# Patient Record
Sex: Female | Born: 1975 | Race: White | Hispanic: No | Marital: Single | Smoking: Current every day smoker
Health system: Southern US, Community
[De-identification: ages and names within clinical notes are randomized; demographics above are authoritative.]

## PROBLEM LIST (undated history)

## (undated) HISTORY — PX: TUBAL LIGATION: SHX77

## (undated) HISTORY — PX: HIP SURGERY: SHX245

## (undated) HISTORY — PX: FOREARM SURGERY: SHX651

---

## 2005-03-20 ENCOUNTER — Emergency Department: Payer: Self-pay | Admitting: Emergency Medicine

## 2005-08-22 ENCOUNTER — Emergency Department: Payer: Self-pay | Admitting: Emergency Medicine

## 2006-01-29 ENCOUNTER — Emergency Department: Payer: Self-pay | Admitting: Emergency Medicine

## 2006-04-20 ENCOUNTER — Emergency Department: Payer: Self-pay | Admitting: Emergency Medicine

## 2006-11-09 ENCOUNTER — Emergency Department: Payer: Self-pay | Admitting: Internal Medicine

## 2007-02-16 ENCOUNTER — Emergency Department: Payer: Self-pay | Admitting: Emergency Medicine

## 2007-03-30 ENCOUNTER — Emergency Department: Payer: Self-pay

## 2007-05-09 ENCOUNTER — Emergency Department: Payer: Self-pay | Admitting: Emergency Medicine

## 2007-07-07 ENCOUNTER — Emergency Department: Payer: Self-pay | Admitting: Emergency Medicine

## 2007-11-23 ENCOUNTER — Emergency Department: Payer: Self-pay | Admitting: Emergency Medicine

## 2008-09-16 ENCOUNTER — Emergency Department: Payer: Self-pay | Admitting: Emergency Medicine

## 2012-04-20 ENCOUNTER — Emergency Department: Payer: Self-pay | Admitting: Emergency Medicine

## 2014-06-20 ENCOUNTER — Emergency Department: Payer: Self-pay | Admitting: Emergency Medicine

## 2015-06-02 ENCOUNTER — Emergency Department
Admission: EM | Admit: 2015-06-02 | Discharge: 2015-06-02 | Disposition: A | Discharge status: Home / Self Care | Payer: Medicaid Other | Attending: Emergency Medicine | Admitting: Emergency Medicine

## 2015-06-02 ENCOUNTER — Encounter: Payer: Self-pay | Admitting: Emergency Medicine

## 2015-06-02 DIAGNOSIS — Z79899 Other long term (current) drug therapy: Secondary | ICD-10-CM | POA: Insufficient documentation

## 2015-06-02 DIAGNOSIS — L501 Idiopathic urticaria: Secondary | ICD-10-CM | POA: Insufficient documentation

## 2015-06-02 DIAGNOSIS — L299 Pruritus, unspecified: Secondary | ICD-10-CM | POA: Diagnosis present

## 2015-06-02 DIAGNOSIS — Z720 Tobacco use: Secondary | ICD-10-CM | POA: Diagnosis not present

## 2015-06-02 MED ORDER — TRIAMCINOLONE ACETONIDE 0.1 % EX OINT: 1.0000 "application " | TOPICAL_OINTMENT | Freq: Two times a day (BID) | CUTANEOUS | Status: DC

## 2015-06-02 MED ORDER — FAMOTIDINE 20 MG PO TABS
40.0000 mg | ORAL_TABLET | Freq: Once | ORAL | Status: AC
  Administered 2015-06-02: 40 mg via ORAL
  Filled 2015-06-02: qty 2

## 2015-06-02 MED ORDER — CYPROHEPTADINE HCL 4 MG PO TABS
4.0000 mg | ORAL_TABLET | Freq: Once | ORAL | Status: AC
  Administered 2015-06-02: 4 mg via ORAL
  Filled 2015-06-02: qty 1

## 2015-06-02 MED ORDER — CYPROHEPTADINE HCL 4 MG PO TABS: 4.0000 mg | ORAL_TABLET | Freq: Three times a day (TID) | ORAL | Status: DC | PRN

## 2015-06-02 MED ORDER — DEXAMETHASONE SODIUM PHOSPHATE 10 MG/ML IJ SOLN
10.0000 mg | Freq: Once | INTRAMUSCULAR | Status: AC
  Administered 2015-06-02: 10 mg via INTRAMUSCULAR
  Filled 2015-06-02: qty 1

## 2015-06-02 MED ORDER — RANITIDINE HCL 150 MG PO TABS: 150.0000 mg | ORAL_TABLET | Freq: Two times a day (BID) | ORAL | Status: DC

## 2015-06-02 MED ORDER — METHYLPREDNISOLONE 4 MG PO TBPK: ORAL_TABLET | ORAL | Status: DC

## 2015-06-02 NOTE — ED Notes (Signed)
Developed itching to hands ,feet and in groin area a few days ago. Had some hives earlier

## 2015-06-02 NOTE — ED Notes (Signed)
C/o itching to hands, feet and in the crease of her knees, denies any rash, symptoms started yesterday

## 2015-06-02 NOTE — ED Provider Notes (Signed)
[facility] Emergency Department Provider Note ____________________________________________  Time seen: 1530  I have reviewed the triage vital signs and the nursing notes.  HISTORY  Chief Complaint  Pruritis  HPI Debra Chandler is a 39 y.o. female reports to the ED for evaluation and treatment of itching that she has noted to her hands, feet, torso and groin area over the last few days. She is now beginning to notice some development of hives and some the scratch areas. She denies any known contacts, exposure, or allergy. 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 He denies any pain in triage.  History reviewed. No pertinent past medical history.  There are no active problems to display for this patient.  Past Surgical History  Procedure Laterality Date  . Forearm surgery    . Hip surgery     Current Outpatient Rx  Name  Route  Sig  Dispense  Refill  . buprenorphine-naloxone (SUBOXONE) 8-2 MG SUBL SL tablet   Sublingual   Place 1 tablet under the tongue daily.         . cyproheptadine (PERIACTIN) 4 MG tablet   Oral   Take 1 tablet (4 mg total) by mouth 3 (three) times daily as needed for allergies.   30 tablet   0   . methylPREDNISolone (MEDROL DOSEPAK) 4 MG TBPK tablet      6 day taper pack as directed.   21 tablet   0   . ranitidine (ZANTAC) 150 MG tablet   Oral   Take 1 tablet (150 mg total) by mouth 2 (two) times daily.   20 tablet   0   . triamcinolone ointment (KENALOG) 0.1 %   Topical   Apply 1 application topically 2 (two) times daily.   30 g   1     Allergies Review of patient's allergies indicates no known allergies.  No family history on file.  Social History Social History  Substance Use Topics  . Smoking status: Current Every Day Smoker  . Smokeless tobacco: None  . Alcohol Use: No    Review of Systems  Constitutional: Negative for fever. Eyes: Negative for visual changes. ENT: Negative for sore throat. Cardiovascular: Negative for chest pain. Respiratory: Negative for shortness of breath. Gastrointestinal: Negative for abdominal pain, vomiting and diarrhea. Genitourinary: Negative for dysuria. Musculoskeletal: Negative for back pain. Skin: Negative for rash. Itching as above. Neurological: Negative for headaches, focal weakness or numbness. ____________________________________________  PHYSICAL EXAM:  VITAL SIGNS: ED Triage Vitals  Enc Vitals Group     BP 06/02/15 1511 112/74 mmHg     Pulse Rate 06/02/15 1511 71     Resp 06/02/15 1511 18     Temp 06/02/15 1511 98.1 F (36.7 C)     Temp Source 06/02/15 1511 Oral     SpO2 06/02/15 1511 98 %     Weight 06/02/15 1511 132 lb (59.875 kg)     Height 06/02/15 1511  (1.651 m)     Head Cir --      Peak Flow --      Pain Score --      Pain Loc --      Pain Edu? --      Excl. in GC? --    Constitutional: Alert and oriented. Well appearing and in no distress. Eyes: Conjunctivae are normal. PERRL. Normal extraocular  movements. ENT   Head: Normocephalic and atraumatic.   Nose: No congestion/rhinorrhea.   Mouth/Throat: Mucous membranes are moist.   Neck: Supple. No thyromegaly. Hematological/Lymphatic/Immunological: No cervical lymphadenopathy. Cardiovascular: Normal rate, regular rhythm.  Respiratory: Normal respiratory effort. No wheezes/rales/rhonchi. Gastrointestinal: Soft and nontender. No distention. Musculoskeletal: Nontender with normal range of motion in all extremities.  Neurologic:  Normal gait without ataxia. Normal speech and language. No gross focal neurologic deficits are appreciated. Skin:  Skin is warm, dry and intact. No rash, burrows, Xopenex is noted to the hands or wrists. Patient with large erythematous patches across the torso and back, with multiple small hives  throughout. Psychiatric: Mood and affect are normal. Patient exhibits appropriate insight and judgment. ____________________________________________  PROCEDURES  Periactin 4 mg PO Famotidine 40 mg PO Decadron 10 mg IM ____________________________________________  INITIAL IMPRESSION / ASSESSMENT AND PLAN / ED COURSE  Idiopathic hives on presentation. No indication of any cellulitis, or mite infestation. Patient will be discharged with prescription for ranitidine, Periactin, and Medrol Dosepak. She can apply topical steroid cream as needed. She will follow-up with primary care provider for ongoing symptoms. ____________________________________________  FINAL CLINICAL IMPRESSION(S) / ED DIAGNOSES  Final diagnoses:  Idiopathic urticaria      Lissa Hoard, PA-C 06/02/15 1556  Darien Ramus, MD 06/02/15 2329

## 2015-06-02 NOTE — Discharge Instructions (Signed)
Hives Hives are itchy, red, swollen areas of the skin. They can vary in size and location on your body. Hives can come and go for hours or several days (acute hives) or for several weeks (chronic hives). Hives do not spread from person to person (noncontagious). They may get worse with scratching, exercise, and emotional stress. CAUSES   Allergic reaction to food, additives, or drugs.  Infections, including the common cold.  Illness, such as vasculitis, lupus, or thyroid disease.  Exposure to sunlight, heat, or cold.  Exercise.  Stress.  Contact with chemicals. SYMPTOMS   Red or white swollen patches on the skin. The patches may change size, shape, and location quickly and repeatedly.  Itching.  Swelling of the hands, feet, and face. This may occur if hives develop deeper in the skin. DIAGNOSIS  Your caregiver can usually tell what is wrong by performing a physical exam. Skin or blood tests may also be done to determine the cause of your hives. In some cases, the cause cannot be determined. TREATMENT  Mild cases usually get better with medicines such as antihistamines. Severe cases may require an emergency epinephrine injection. If the cause of your hives is known, treatment includes avoiding that trigger.  HOME CARE INSTRUCTIONS   Avoid causes that trigger your hives.  Take antihistamines as directed by your caregiver to reduce the severity of your hives. Non-sedating or low-sedating antihistamines are usually recommended. Do not drive while taking an antihistamine.  Take any other medicines prescribed for itching as directed by your caregiver.  Wear loose-fitting clothing.  Keep all follow-up appointments as directed by your caregiver. SEEK MEDICAL CARE IF:   You have persistent or severe itching that is not relieved with medicine.  You have painful or swollen joints. SEEK IMMEDIATE MEDICAL CARE IF:   You have a fever.  Your tongue or lips are swollen.  You have  trouble breathing or swallowing.  You feel tightness in the throat or chest.  You have abdominal pain. These problems may be the first sign of a life-threatening allergic reaction. Call your local emergency services (911 in [place] MAKE SURE YOU:   Understand these instructions.  Will watch your condition.  Will get help right away if you are not doing well or get worse. Document Released: 08/15/2005 Document Revised: 08/20/2013 Document Reviewed: 11/08/2011 [facility] Patient Information 2015 [place], [place]. This information is not intended to replace advice given to you by your health care provider. Make sure you discuss any questions you have with your health care provider.  Dose the prescription steroid taper pack until completely gone. Take the antihistamines, as needed. Follow-up with your provider as needed.
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
## 2015-06-05 ENCOUNTER — Encounter: Payer: Self-pay | Admitting: *Deleted

## 2015-06-05 ENCOUNTER — Emergency Department
Admission: EM | Admit: 2015-06-05 | Discharge: 2015-06-05 | Disposition: A | Discharge status: Home / Self Care | Payer: Medicaid Other | Attending: Emergency Medicine | Admitting: Emergency Medicine

## 2015-06-05 DIAGNOSIS — Z79899 Other long term (current) drug therapy: Secondary | ICD-10-CM | POA: Diagnosis not present

## 2015-06-05 DIAGNOSIS — Z720 Tobacco use: Secondary | ICD-10-CM | POA: Diagnosis not present

## 2015-06-05 DIAGNOSIS — K047 Periapical abscess without sinus: Secondary | ICD-10-CM | POA: Diagnosis not present

## 2015-06-05 DIAGNOSIS — K0889 Other specified disorders of teeth and supporting structures: Secondary | ICD-10-CM | POA: Diagnosis present

## 2015-06-05 MED ORDER — AMOXICILLIN-POT CLAVULANATE 875-125 MG PO TABS: 1.0000 | ORAL_TABLET | Freq: Two times a day (BID) | ORAL | Status: DC

## 2015-06-05 MED ORDER — MAGIC MOUTHWASH W/LIDOCAINE: 5.0000 mL | Freq: Four times a day (QID) | ORAL | Status: DC

## 2015-06-05 NOTE — ED Notes (Signed)
Pt reports right sided dental pain starting yesterday.

## 2015-06-05 NOTE — Discharge Instructions (Signed)

## 2015-06-05 NOTE — ED Provider Notes (Signed)
[facility] Emergency Department Provider Note  ____________________________________________  Time seen: Approximately 9:19 PM  I have reviewed the triage vital signs and the nursing notes.   HISTORY  Chief Complaint Dental Pain    HPI Debra Chandler is a 39 y.o. female presents to the emergency department complaining of dental pain on the right lower jaw. She states that she has a history of poor dentition and has a cracked tooth on that side that she pleases abscess. She states the pain is been increasing over the last few days but became unbearable this night. She ascribes it has a sharp pressure sensation. She denies any fever or chills. She denies any difficulty swallowing. She has not taken anything over-the-counter prior to arrival.   No past medical history on file.  There are no active problems to display for this patient.   Past Surgical History  Procedure Laterality Date  . Forearm surgery    . Hip surgery      Current Outpatient Rx  Name  Route  Sig  Dispense  Refill  . amoxicillin-clavulanate (AUGMENTIN) 875-125 MG tablet   Oral   Take 1 tablet by mouth 2 (two) times daily.   14 tablet   0   . buprenorphine-naloxone (SUBOXONE) 8-2 MG SUBL SL tablet   Sublingual   Place 1 tablet under the tongue daily.         . cyproheptadine (PERIACTIN) 4 MG tablet   Oral   Take 1 tablet (4 mg total) by mouth 3 (three) times daily as needed for allergies.   30 tablet   0   . magic mouthwash w/lidocaine SOLN   Oral   Take 5 mLs by mouth 4 (four) times daily.   240 mL   0     Dispense in 1/1/1/1 ratio   . methylPREDNISolone (MEDROL DOSEPAK) 4 MG TBPK tablet      6 day taper pack as directed.   21 tablet   0   . ranitidine (ZANTAC) 150 MG tablet   Oral   Take 1 tablet (150 mg total) by mouth 2 (two) times daily.   20 tablet   0   . triamcinolone ointment (KENALOG) 0.1 %   Topical   Apply 1 application topically 2 (two) times  daily.   30 g   1     Allergies Review of patient's allergies indicates no known allergies.  No family history on file.  Social History Social History  Substance Use Topics  . Smoking status: Current Every Day Smoker  . Smokeless tobacco: None  . Alcohol Use: No    Review of Systems Constitutional: No fever/chills Eyes: No visual changes. ENT: No sore throat. Endorses right lower jaw dental pain. Cardiovascular: Denies chest pain. Respiratory: Denies shortness of breath. Gastrointestinal: No abdominal pain.  No nausea, no vomiting.  No diarrhea.  No constipation. Genitourinary: Negative for dysuria. Musculoskeletal: Negative for back pain. Skin: Negative for rash. Neurological: Negative for headaches, focal weakness or numbness.  10-point ROS otherwise negative.  ____________________________________________   PHYSICAL EXAM:  VITAL SIGNS: ED Triage Vitals  Enc Vitals Group     BP 06/05/15 2107 122/79 mmHg     Pulse Rate 06/05/15 2107 80     Resp 06/05/15 2107 20     Temp 06/05/15 2107 98.2 F (36.8 C)     Temp Source 06/05/15 2107 Oral     SpO2 06/05/15 2107 100 %     Weight 06/05/15 2107 136 lb (  61.689 kg)     Height 06/05/15 2107  (1.651 m)     Head Cir --      Peak Flow --      Pain Score 06/05/15 2107 10     Pain Loc --      Pain Edu? --      Excl. in GC? --     Constitutional: Alert and oriented. Well appearing and in no acute distress. Eyes: Conjunctivae are normal. PERRL. EOMI. Head: Atraumatic. Nose: No congestion/rhinnorhea. Mouth/Throat: Mucous membranes are moist.  Oropharynx non-erythematous. Edema and erythema surrounding the second molar right lower jaw. Molar is broken. No fluctuance noted when palpated with tongue pressure. No drainage around tooth noted. Neck: No stridor.   Hematological/Lymphatic/Immunilogical: No cervical lymphadenopathy. Cardiovascular: Normal rate, regular rhythm. Grossly normal heart sounds.  Good peripheral  circulation. Respiratory: Normal respiratory effort.  No retractions. Lungs CTAB. Gastrointestinal: Soft and nontender. No distention. No abdominal bruits. No CVA tenderness. Musculoskeletal: No lower extremity tenderness nor edema.  No joint effusions. Neurologic:  Normal speech and language. No gross focal neurologic deficits are appreciated. No gait instability. Skin:  Skin is warm, dry and intact. No rash noted. Psychiatric: Mood and affect are normal. Speech and behavior are normal.  ____________________________________________   LABS (all labs ordered are listed, but only abnormal results are displayed)  Labs Reviewed - No data to display ____________________________________________  EKG   ____________________________________________  RADIOLOGY   ____________________________________________   PROCEDURES  Procedure(s) performed: None  Critical Care performed: No  ____________________________________________   INITIAL IMPRESSION / ASSESSMENT AND PLAN / ED COURSE  Pertinent labs & imaging results that were available during my care of the patient were reviewed by me and considered in my medical decision making (see chart for details).  The patient's history, symptoms, and physical exam are consistent with a dental abscess right lower jaw. Advised patient of findings and diagnosis and she verbalizes understanding of same. Advised the patient I will put her on antibiotics. Patient states that she has a dentist appointment within the week and advised patient to follow-up with them. Patient verbalizes understanding and compliance with treatment plan. ____________________________________________   FINAL CLINICAL IMPRESSION(S) / ED DIAGNOSES  Final diagnoses:  Dental abscess      Racheal Patches, PA-C 06/05/15 2147  Emily Filbert, MD 06/05/15 2217

## 2015-06-05 NOTE — ED Notes (Signed)
AAOx3.  Skin warm and dry.  NAD 

## 2015-06-05 NOTE — ED Notes (Signed)
Pt has a right lower toothache for 2 days.   Pain worse today.   Taking otc meds without relief.

## 2019-04-23 ENCOUNTER — Other Ambulatory Visit: Payer: Self-pay

## 2019-04-23 ENCOUNTER — Ambulatory Visit
Admission: EM | Admit: 2019-04-23 | Discharge: 2019-04-23 | Disposition: A | Discharge status: Home / Self Care | Payer: BLUE CROSS/BLUE SHIELD | Attending: Family Medicine | Admitting: Family Medicine

## 2019-04-23 DIAGNOSIS — N760 Acute vaginitis: Secondary | ICD-10-CM | POA: Diagnosis not present

## 2019-04-23 DIAGNOSIS — B9689 Other specified bacterial agents as the cause of diseases classified elsewhere: Secondary | ICD-10-CM

## 2019-04-23 LAB — URINALYSIS, COMPLETE (UACMP) WITH MICROSCOPIC
Bilirubin Urine: NEGATIVE
Glucose, UA: NEGATIVE mg/dL
Ketones, ur: NEGATIVE mg/dL
Leukocytes,Ua: NEGATIVE
Nitrite: NEGATIVE
Protein, ur: NEGATIVE mg/dL
Specific Gravity, Urine: 1.01 (ref 1.005–1.030)
pH: 7 (ref 5.0–8.0)

## 2019-04-23 LAB — WET PREP, GENITAL
Sperm: NONE SEEN
Trich, Wet Prep: NONE SEEN
Yeast Wet Prep HPF POC: NONE SEEN

## 2019-04-23 MED ORDER — FLUCONAZOLE 150 MG PO TABS: ORAL_TABLET | ORAL | 0 refills | Status: DC

## 2019-04-23 MED ORDER — METRONIDAZOLE 0.75 % VA GEL: 1.0000 | Freq: Two times a day (BID) | VAGINAL | 0 refills | Status: DC

## 2019-04-23 MED ORDER — METRONIDAZOLE 500 MG PO TABS: 500.0000 mg | ORAL_TABLET | Freq: Two times a day (BID) | ORAL | 0 refills | Status: DC

## 2019-04-23 NOTE — Discharge Instructions (Signed)
It was very nice seeing you today in clinic. Thank you for entrusting me with your care.   Please utilize the medications that we discussed. Your prescriptions have been called in to your pharmacy. Increase fluid intake as much as possible. Water is always best.  Make arrangements to follow up with your regular doctor in 1 week for re-evaluation if not improving. I have provided you the name and office contact information for an excellent local provider (urologist) for you to see and discuss urinary concerns. If your symptoms/condition worsens, please seek follow up care either here or in the ER. Please remember, our [place] providers are "right here with you" when you need [place].   Again, it was my pleasure to take care of you today. Thank you for choosing our clinic. I hope that you start to feel better quickly.   Honor Loh, MSN, APRN, FNP-C, CEN Advanced Practice Provider [facility]

## 2019-04-23 NOTE — ED Triage Notes (Signed)
Patient states that she has been having urinary frequency and urgency. Patient states that urine has a smell and she is concerned about yeast and BV as well.

## 2019-04-23 NOTE — ED Provider Notes (Signed)
[place], [place]   Name: Debra Chandler DOB: 06/21/1976 MRN: [id number] CSN: [id number] [facility]CP: [facility] date and time:  04/23/19 1211  Chief Complaint:  Urinary Frequency   NOTE: Prior to seeing the patient today, I have reviewed the triage nursing documentation and vital signs. Clinical staff has updated patient's PMH/PSHx, current medication list, and drug allergies/intolerances to ensure comprehensive history available to assist in medical decision making.   History:   HPI: Debra Chandler is a 43 y.o. female who presents today with complaints of urinary symptoms that have been going on "for awhile". She complains of frequency and urgency, but denies dysuria. She has not appreciated any gross hematuria. She advises that her urine has been malodorous, with the odor being described as "burnt rubber". Patient denies any associated nausea, vomiting, fever, and chills. She has not experienced any pain in her lower back or flank area. Patient complains of significant gas pains today that caused her to be sent home from work. Patient reports that the pain radiated to her "lady parts" (vagina) and resolved after she was able to "boo boo" (defecate).   Patient denies that she engages in unprotected sexual activity; advising that she is currently not active at all. She denies any current vaginal pain or bleeding. She notes malodorous vaginal discharge that is described as "white and watery" for over a year. She states, "I have recurrent BV and they told me I needed to see a specialist, but I lost my insurance and it never happened. I have insurance now". She has been treating her BV with essential oils and diatomaceous earth. Patient just started a new job and reports that she cannot do her treatments as she has been doing, thus her symptoms have worsened.  Patient's last menstrual period was 04/08/2019. There are no concerns that she is currently pregnant.   History reviewed. No  pertinent past medical history.  Past Surgical History:  Procedure Laterality Date  . FOREARM SURGERY    . HIP SURGERY      Family History  Problem Relation Age of Onset  . Diabetes Father   . Cancer Father     Social History   Tobacco Use  . Smoking status: Current Every Day Smoker    Packs/day: 1.00    Types: Cigarettes  . Smokeless tobacco: Never Used  Substance Use Topics  . Alcohol use: No  . Drug use: Not Currently    There are no active problems to display for this patient.   Home Medications:    No outpatient medications have been marked as taking for the 04/23/19 encounter [facility] Encounter).    Allergies:   Patient has no known allergies.  Review of Systems (ROS): Review of Systems  Constitutional: Negative for chills and fever.  Respiratory: Negative for cough and shortness of breath.   Cardiovascular: Negative for chest pain and palpitations.  Gastrointestinal: Positive for abdominal pain (suprapubic). Negative for diarrhea, nausea and vomiting.  Genitourinary: Positive for frequency, urgency and vaginal discharge. Negative for decreased urine volume, dysuria, flank pain, genital sores, hematuria, pelvic pain, vaginal bleeding and vaginal pain.  Musculoskeletal: Negative for back pain.  Skin: Negative for color change, pallor and rash.  Neurological: Negative for dizziness, syncope, weakness and headaches.  All other systems reviewed and are negative.    Vital Signs: Today's Vitals   04/23/19 1230 04/23/19 1231 04/23/19 1312 04/23/19 1317  BP:  119/61    Pulse:  (!) 103    Resp:  17    Temp:  98.5 F (36.9 C)    TempSrc:  Oral    SpO2:  99%    Weight: 105 lb (47.6 kg)     Height: 5\' 5"  (1.651 m)     PainSc: 3   3  8      Physical Exam: Physical Exam  Constitutional: She is oriented to person, place, and time and well-developed, well-nourished, and in no distress.  HENT:  Head: Normocephalic and atraumatic.  Mouth/Throat: Mucous  membranes are normal.  Eyes: Pupils are equal, round, and reactive to light.  Neck: Normal range of motion. Neck supple. No tracheal deviation present.  Cardiovascular: Normal rate, regular rhythm, normal heart sounds and intact distal pulses. Exam reveals no gallop and no friction rub.  No murmur heard. Pulmonary/Chest: Effort normal and breath sounds normal. No respiratory distress. She has no wheezes. She has no rales.  Abdominal: Soft. Normal appearance and bowel sounds are normal. There is abdominal tenderness in the suprapubic area. There is no CVA tenderness.  Genitourinary:    Genitourinary Comments: Exam deferred. No vaginal/pelvic pain or bleeding. Patient is not currently pregnant. She has elected to self collect specimen swab for wet prep.    Lymphadenopathy:    She has no cervical adenopathy.  Neurological: She is alert and oriented to person, place, and time. Gait normal.  Skin: Skin is warm and dry. No rash noted.  Psychiatric: Mood, memory, affect and judgment normal.  Nursing note and vitals reviewed.   Urgent Care Treatments / Results:   LABS: PLEASE NOTE: all labs that were ordered this encounter are listed, however only abnormal results are displayed. Labs Reviewed  WET PREP, GENITAL - Abnormal; Notable for the following components:      Result Value   Clue Cells Wet Prep HPF POC PRESENT (*)    WBC, Wet Prep HPF POC MODERATE (*)    All other components within normal limits  URINALYSIS, COMPLETE (UACMP) WITH MICROSCOPIC - Abnormal; Notable for the following components:   Hgb urine dipstick TRACE (*)    Bacteria, UA FEW (*)    All other components within normal limits    EKG: -None  RADIOLOGY: No results found.  PROCEDURES: Procedures  MEDICATIONS RECEIVED THIS VISIT: Medications - No data to display  PERTINENT CLINICAL COURSE NOTES/UPDATES:   Initial Impression / Assessment and Plan / Urgent Care Course:  Pertinent labs & imaging results that were  available during my care of the patient were personally reviewed by me and considered in my medical decision making (see lab/imaging section of note for values and interpretations).  Debra Chandler is a 43 y.o. female who presents to [facility] today with complaints of Urinary Frequency   Patient is well appearing overall in clinic today. She does not appear to be in any acute distress. Presenting symptoms (see HPI) and exam as documented above. UA negative for infection. Wet prep (+) for clue cells. Will treat for BV using a 7 day course of metronidazole. Patient encouraged to avoid ETOH while using this medication to present dilsulfram like reaction that will result in nausea and vomiting. She was advised to increase her fluid intake in efforts to flush urinary tract. She notes that she always gets a resulting candidal infection when taking metronidazole. Will send in a prescription for fluconazole for PRN use. Patient requesting to see someone about her malodorous urine. Name and office contact information provided on today's AVS for Dr. Ilean Skill. Patient advised the  she will need to contact the office to schedule an appointment to be seen.   Discussed follow up with primary care physician in 1 week for re-evaluation. I have reviewed the follow up and strict return precautions for any new or worsening symptoms. Patient is aware of symptoms that would be deemed urgent/emergent, and would thus require further evaluation either here or in the emergency department. At the time of discharge, she verbalized understanding and consent with the discharge plan as it was reviewed with her. All questions were fielded by provider and/or clinic staff prior to patient discharge.    Final Clinical Impressions / Urgent Care Diagnoses:   Final diagnoses:  BV (bacterial vaginosis)    New Prescriptions:  [place] Controlled Substance Registry consulted? Not Applicable  Meds ordered this encounter   Medications  . metroNIDAZOLE (FLAGYL) 500 MG tablet    Sig: Take 1 tablet (500 mg total) by mouth 2 (two) times daily.    Dispense:  14 tablet    Refill:  0  . fluconazole (DIFLUCAN) 150 MG tablet    Sig: Take 1 tablet (150 mg) by mouth. May repeat dose in 3 days if still having symptoms.    Dispense:  2 tablet    Refill:  0    Recommended Follow up Care:  Patient encouraged to follow up with the following provider within the specified time frame, or sooner as dictated by the severity of her symptoms. As always, she was instructed that for any urgent/emergent care needs, she should seek care either here or in the emergency department for more immediate evaluation.  Follow-up Information    Call  Ilean Skillaniel, Michael, MD.   Specialty: Unknown Physician Specialty Contact information: [facility] [address] [place] [address] [phone]         NOTE: This note was prepared using Dragon dictation software along with smaller phrase technology. Despite my best ability to proofread, there is the potential that transcriptional errors may still occur from this process, and are completely unintentional.    Verlee MonteGray, Yul Diana E, NP 04/23/19 1333

## 2019-06-07 ENCOUNTER — Ambulatory Visit
Admission: EM | Admit: 2019-06-07 | Discharge: 2019-06-07 | Disposition: A | Discharge status: Home / Self Care | Payer: BLUE CROSS/BLUE SHIELD | Attending: Emergency Medicine | Admitting: Emergency Medicine

## 2019-06-07 DIAGNOSIS — B373 Candidiasis of vulva and vagina: Secondary | ICD-10-CM

## 2019-06-07 DIAGNOSIS — B3731 Acute candidiasis of vulva and vagina: Secondary | ICD-10-CM

## 2019-06-07 DIAGNOSIS — Z113 Encounter for screening for infections with a predominantly sexual mode of transmission: Secondary | ICD-10-CM

## 2019-06-07 DIAGNOSIS — Z3202 Encounter for pregnancy test, result negative: Secondary | ICD-10-CM

## 2019-06-07 DIAGNOSIS — Z711 Person with feared health complaint in whom no diagnosis is made: Secondary | ICD-10-CM

## 2019-06-07 LAB — URINALYSIS, COMPLETE (UACMP) WITH MICROSCOPIC
Bilirubin Urine: NEGATIVE
Glucose, UA: NEGATIVE mg/dL
Ketones, ur: NEGATIVE mg/dL
Leukocytes,Ua: NEGATIVE
Nitrite: NEGATIVE
Protein, ur: NEGATIVE mg/dL
Specific Gravity, Urine: 1.025 (ref 1.005–1.030)
pH: 6 (ref 5.0–8.0)

## 2019-06-07 LAB — WET PREP, GENITAL
Clue Cells Wet Prep HPF POC: NONE SEEN
Sperm: NONE SEEN
Trich, Wet Prep: NONE SEEN

## 2019-06-07 LAB — PREGNANCY, URINE: Preg Test, Ur: NEGATIVE

## 2019-06-07 MED ORDER — CEFTRIAXONE SODIUM 250 MG IJ SOLR
250.0000 mg | Freq: Once | INTRAMUSCULAR | Status: AC
  Administered 2019-06-07: 16:00:00 250 mg via INTRAMUSCULAR

## 2019-06-07 MED ORDER — AZITHROMYCIN 500 MG PO TABS
1000.0000 mg | ORAL_TABLET | Freq: Once | ORAL | Status: AC
  Administered 2019-06-07: 1000 mg via ORAL

## 2019-06-07 MED ORDER — FLUCONAZOLE 150 MG PO TABS: ORAL_TABLET | ORAL | 0 refills | Status: DC

## 2019-06-07 NOTE — ED Provider Notes (Signed)
[facility]    CSN: [id number] Arrival date & time: 06/07/19  1422      History   Chief Complaint Chief Complaint  Patient presents with  . Vaginal Discharge    HPI Debra Chandler is a 43 y.o. female.   Debra Chandler presents with complaints of vaginal discharge which now has become more heavy and "mucus" like. States she has had issues with recurrent/chronic BV for the past 5 years with multiple courses of antibiotics for this. Was seen here 8/26 with findings of BV, received treatment. States her symptoms briefly improved before returning. However, over the past two days this new mucus discharge started. She was recently sexually active with a partner, didn't use condoms. She is concerned about an STD, as states this feels similar to chlaymdia which she had many years ago. States her urine has an odor but otherwise no urinary symptoms. LMP was approximately a month ago, states she was "neutered."  No vaginal bleeding, pelvic or back pain. No fevers. Denies sores, lesions or open skin.     ROS per HPI, negative if not otherwise mentioned.      No past medical history on file.  There are no active problems to display for this patient.   Past Surgical History:  Procedure Laterality Date  . FOREARM SURGERY    . HIP SURGERY      OB History   No obstetric history on file.      Home Medications    Prior to Admission medications   Medication Sig Start Date End Date Taking? Authorizing Provider  fluconazole (DIFLUCAN) 150 MG tablet Take 1 tablet today. If still with symptoms may repeat in 3 days. 06/07/19   Zigmund Gottron, NP  metroNIDAZOLE (FLAGYL) 500 MG tablet Take 1 tablet (500 mg total) by mouth 2 (two) times daily. 04/23/19   Karen Kitchens, NP  cyproheptadine (PERIACTIN) 4 MG tablet Take 1 tablet (4 mg total) by mouth 3 (three) times daily as needed for allergies. 06/02/15 04/23/19  Menshew, Dannielle Karvonen, PA-C  ranitidine (ZANTAC) 150 MG tablet Take 1  tablet (150 mg total) by mouth 2 (two) times daily. 06/02/15 04/23/19  Menshew, Dannielle Karvonen, PA-C    Family History Family History  Problem Relation Age of Onset  . Diabetes Father   . Cancer Father     Social History Social History   Tobacco Use  . Smoking status: Current Every Day Smoker    Packs/day: 1.00    Types: Cigarettes  . Smokeless tobacco: Never Used  Substance Use Topics  . Alcohol use: No  . Drug use: Not Currently     Allergies   Patient has no known allergies.   Review of Systems Review of Systems   Physical Exam Triage Vital Signs ED Triage Vitals  Enc Vitals Group     BP 06/07/19 1443 111/65     Pulse Rate 06/07/19 1443 87     Resp 06/07/19 1443 16     Temp 06/07/19 1443 98.6 F (37 C)     Temp Source 06/07/19 1443 Oral     SpO2 06/07/19 1443 96 %     Weight 06/07/19 1440 110 lb (49.9 kg)     Height 06/07/19 1440 5\' 5"  (1.651 m)     Head Circumference --      Peak Flow --      Pain Score 06/07/19 1440 0     Pain Loc --  Pain Edu? --      Excl. in [place]? --    No data found.  Updated Vital Signs BP 111/65 (BP Location: Left Arm)   Pulse 87   Temp 98.6 F (37 C) (Oral)   Resp 16   Ht 5\' 5"  (1.651 m)   Wt 110 lb (49.9 kg)   SpO2 96%   BMI 18.30 kg/m    Physical Exam Constitutional:      General: She is not in acute distress.    Appearance: She is well-developed.  Cardiovascular:     Rate and Rhythm: Normal rate.  Pulmonary:     Effort: Pulmonary effort is normal.  Abdominal:     Palpations: Abdomen is soft. Abdomen is not rigid.     Tenderness: There is no abdominal tenderness. There is no guarding or rebound.  Genitourinary:    Comments: Denies sores, lesions, vaginal bleeding; no pelvic pain; gu exam deferred at this time, vaginal self swab collected.   Skin:    General: Skin is warm and dry.  Neurological:     Mental Status: She is alert and oriented to person, place, and time.      UC Treatments / Results   Labs (all labs ordered are listed, but only abnormal results are displayed) Labs Reviewed  WET PREP, GENITAL - Abnormal; Notable for the following components:      Result Value   Yeast Wet Prep HPF POC PRESENT (*)    WBC, Wet Prep HPF POC FEW (*)    All other components within normal limits  URINALYSIS, COMPLETE (UACMP) WITH MICROSCOPIC - Abnormal; Notable for the following components:   Hgb urine dipstick SMALL (*)    Bacteria, UA FEW (*)    All other components within normal limits  GC/CHLAMYDIA PROBE AMP  URINE CULTURE  PREGNANCY, URINE    EKG   Radiology No results found.  Procedures Procedures (including critical care time)  Medications Ordered in UC Medications  azithromycin (ZITHROMAX) tablet 1,000 mg (1,000 mg Oral Given 06/07/19 1602)  cefTRIAXone (ROCEPHIN) injection 250 mg (250 mg Intramuscular Given 06/07/19 1608)    Initial Impression / Assessment and Plan / UC Course  I have reviewed the triage vital signs and the nursing notes.  Pertinent labs & imaging results that were available during my care of the patient were reviewed by me and considered in my medical decision making (see chart for details).     Empiric treatment with azithromycin and rocephin provided here today pending sti testing. Will notify of any positive findings and if any changes to treatment are needed.  Yeast on wet prep treated with diflucan. Urine culture pending, few bacteria noted. Encouraged follow up with gyne for recurrent vaginal symptoms. Patient verbalized understanding and agreeable to plan.  Ambulatory out of clinic without difficulty.    Final Clinical Impressions(s) / UC Diagnoses   Final diagnoses:  Concern about STD in female without diagnosis  Yeast vaginitis     Discharge Instructions     We have treated you today for gonorrhea and chlamydia with results pending.  Will notify of any positive findings and if any changes to treatment are needed.  Your vaginal swab  does have yeast, but no indication of BV today. We will treat this as well.  Please follow up with gynecology due to your recurrent symptoms.       ED Prescriptions    Medication Sig Dispense Auth. Provider   fluconazole (DIFLUCAN) 150 MG tablet Take 1  tablet today. If still with symptoms may repeat in 3 days. 2 tablet Georgetta Haber, NP     PDMP not reviewed this encounter.   Georgetta Haber, NP 06/07/19 1623

## 2019-06-07 NOTE — ED Triage Notes (Addendum)
As per patient has foul urine smell and burning she was seen in past for UTI and finished Antibiotics IN 03/2019 but after finishing her antibiotics her Sxs came back she also has some vaginal discharge onset 2 days.

## 2019-06-07 NOTE — Discharge Instructions (Addendum)
We have treated you today for gonorrhea and chlamydia with results pending.  Will notify of any positive findings and if any changes to treatment are needed.  Your vaginal swab does have yeast, but no indication of BV today. We will treat this as well.  Please follow up with gynecology due to your recurrent symptoms.

## 2019-06-09 LAB — GC/CHLAMYDIA PROBE AMP
Chlamydia trachomatis, NAA: NEGATIVE
Neisseria Gonorrhoeae by PCR: NEGATIVE

## 2019-06-10 ENCOUNTER — Telehealth (HOSPITAL_COMMUNITY): Payer: Self-pay | Admitting: Emergency Medicine

## 2019-06-10 ENCOUNTER — Encounter (HOSPITAL_COMMUNITY): Payer: Self-pay

## 2019-06-10 LAB — URINE CULTURE: Culture: 80000 — AB

## 2019-06-10 MED ORDER — CEPHALEXIN 500 MG PO CAPS: 500.0000 mg | ORAL_CAPSULE | Freq: Two times a day (BID) | ORAL | 0 refills | Status: AC

## 2019-06-10 MED ORDER — FLUCONAZOLE 150 MG PO TABS: 150.0000 mg | ORAL_TABLET | Freq: Once | ORAL | 0 refills | Status: AC

## 2019-06-10 NOTE — Telephone Encounter (Signed)
Pt requesting treatment for yeast, took both pills already, okay to send another per natalie

## 2019-06-10 NOTE — Telephone Encounter (Signed)
Per natalie, send keflex 500 bid x7 days to pharmacy on record.

## 2019-06-10 NOTE — Telephone Encounter (Signed)
Not on antibiotics, Attempted to reach patient to see how she was feeling. No answer at this time. Voicemail left.

## 2020-01-13 ENCOUNTER — Other Ambulatory Visit: Payer: Self-pay

## 2020-01-13 ENCOUNTER — Ambulatory Visit
Admission: EM | Admit: 2020-01-13 | Discharge: 2020-01-13 | Disposition: A | Discharge status: Home / Self Care | Payer: BLUE CROSS/BLUE SHIELD | Attending: Family Medicine | Admitting: Family Medicine

## 2020-01-13 DIAGNOSIS — Z202 Contact with and (suspected) exposure to infections with a predominantly sexual mode of transmission: Secondary | ICD-10-CM | POA: Diagnosis present

## 2020-01-13 LAB — CHLAMYDIA/NGC RT PCR (ARMC ONLY)
Chlamydia Tr: NOT DETECTED
N gonorrhoeae: NOT DETECTED

## 2020-01-13 MED ORDER — AZITHROMYCIN 250 MG PO TABS: 1000.0000 mg | ORAL_TABLET | Freq: Once | ORAL | 0 refills | Status: AC

## 2020-01-13 MED ORDER — FLUCONAZOLE 150 MG PO TABS: 150.0000 mg | ORAL_TABLET | Freq: Once | ORAL | 0 refills | Status: AC

## 2020-01-13 NOTE — ED Provider Notes (Signed)
[facility]    CSN: [id number] Arrival date & time: 01/13/20  1510      History   Chief Complaint Chief Complaint  Patient presents with  . Exposure to STD   HPI  44 year old female presents with the above complaint.  Patient reports that she was notified by her sexual partner today that he tested positive for chlamydia.  She states that she is currently asymptomatic.  Patient is feeling well.  She desires testing today.  She would also like empiric treatment.  No other complaints or concerns at this time.  Past Surgical History:  Procedure Laterality Date  . FOREARM SURGERY    . HIP SURGERY     OB History   No obstetric history on file.    Home Medications    Prior to Admission medications   Medication Sig Start Date End Date Taking? Authorizing Provider  azithromycin (ZITHROMAX) 250 MG tablet Take 4 tablets (1,000 mg total) by mouth once for 1 dose. 01/13/20 01/13/20  Coral Spikes, DO  fluconazole (DIFLUCAN) 150 MG tablet Take 1 tablet (150 mg total) by mouth once for 1 dose. Repeat dose in 72 hours. 01/13/20 01/13/20  Coral Spikes, DO  cyproheptadine (PERIACTIN) 4 MG tablet Take 1 tablet (4 mg total) by mouth 3 (three) times daily as needed for allergies. 06/02/15 04/23/19  Menshew, Dannielle Karvonen, PA-C  ranitidine (ZANTAC) 150 MG tablet Take 1 tablet (150 mg total) by mouth 2 (two) times daily. 06/02/15 04/23/19  Menshew, Dannielle Karvonen, PA-C   Family History Family History  Problem Relation Age of Onset  . Diabetes Father   . Cancer Father    Social History Social History   Tobacco Use  . Smoking status: Current Every Day Smoker    Packs/day: 0.75    Types: Cigarettes  . Smokeless tobacco: Never Used  Substance Use Topics  . Alcohol use: No  . Drug use: Yes    Types: Marijuana    Comment: last use yesterday   Allergies   Patient has no known allergies.   Review of Systems Review of Systems  Constitutional: Negative.   Genitourinary:  Negative.    Physical Exam Triage Vital Signs ED Triage Vitals  Enc Vitals Group     BP --      Pulse Rate 01/13/20 1626 69     Resp 01/13/20 1626 16     Temp 01/13/20 1626 98.1 F (36.7 C)     Temp Source 01/13/20 1626 Oral     SpO2 01/13/20 1626 100 %     Weight 01/13/20 1624 110 lb (49.9 kg)     Height 01/13/20 1624 5\' 5"  (1.651 m)     Head Circumference --      Peak Flow --      Pain Score 01/13/20 1624 0     Pain Loc --      Pain Edu? --      Excl. in [place]? --    Updated Vital Signs Pulse 69   Temp 98.1 F (36.7 C) (Oral)   Resp 16   Ht 5\' 5"  (1.651 m)   Wt 49.9 kg   LMP 12/24/2019   SpO2 100%   BMI 18.30 kg/m   Visual Acuity Right Eye Distance:   Left Eye Distance:   Bilateral Distance:    Right Eye Near:   Left Eye Near:    Bilateral Near:     Physical Exam Vitals and nursing note reviewed.  Constitutional:      General: She is not in acute distress.    Appearance: Normal appearance. She is not ill-appearing.  HENT:     Head: Normocephalic and atraumatic.  Eyes:     General:        Right eye: No discharge.        Left eye: No discharge.     Conjunctiva/sclera: Conjunctivae normal.  Cardiovascular:     Rate and Rhythm: Normal rate and regular rhythm.     Heart sounds: No murmur.  Pulmonary:     Effort: Pulmonary effort is normal. No respiratory distress.     Breath sounds: Normal breath sounds. No wheezing, rhonchi or rales.  Neurological:     Mental Status: She is alert.  Psychiatric:        Mood and Affect: Mood normal.        Behavior: Behavior normal.    UC Treatments / Results  Labs (all labs ordered are listed, but only abnormal results are displayed) Labs Reviewed  CHLAMYDIA/NGC RT PCR [facility] ONLY)    EKG   Radiology No results found.  Procedures Procedures (including critical care time)  Medications Ordered in UC Medications - No data to display  Initial Impression / Assessment and Plan / UC Course  I have  reviewed the triage vital signs and the nursing notes.  Pertinent labs & imaging results that were available during my care of the patient were reviewed by me and considered in my medical decision making (see chart for details).    44 year old female presents with STD exposure.  Patient desired empiric treatment today.  Azithromycin sent to the pharmacy.  We did not have a total of 1000 mg of azithromycin here today.  Diflucan if needed for yeast vaginitis.  Supportive care.  Final Clinical Impressions(s) / UC Diagnoses   Final diagnoses:  Exposure to STD     Discharge Instructions     Test results should return in 24 hours.  Antibiotic as prescribed.  No intercourse for the next 7 days.   Take care  Dr. Adriana Simas    ED Prescriptions    Medication Sig Dispense Auth. Provider   azithromycin (ZITHROMAX) 250 MG tablet Take 4 tablets (1,000 mg total) by mouth once for 1 dose. 4 tablet Robi Dewolfe G, DO   fluconazole (DIFLUCAN) 150 MG tablet Take 1 tablet (150 mg total) by mouth once for 1 dose. Repeat dose in 72 hours. 2 tablet Tommie Sams, DO     PDMP not reviewed this encounter.   Tommie Sams, [place] 01/13/20 2021

## 2020-01-13 NOTE — ED Triage Notes (Signed)
Pt reports her sexual partner called her today and stated he was positive for chlamydia. Pt denies sx currently. Last exposure yesterday.

## 2020-01-13 NOTE — Discharge Instructions (Signed)
Test results should return in 24 hours.  Antibiotic as prescribed.  No intercourse for the next 7 days.   Take care  Dr. Adriana Simas

## 2020-06-17 ENCOUNTER — Encounter: Payer: Self-pay | Admitting: Emergency Medicine

## 2020-06-17 ENCOUNTER — Other Ambulatory Visit: Payer: Self-pay

## 2020-06-17 DIAGNOSIS — S51852A Open bite of left forearm, initial encounter: Secondary | ICD-10-CM | POA: Diagnosis present

## 2020-06-17 DIAGNOSIS — F17210 Nicotine dependence, cigarettes, uncomplicated: Secondary | ICD-10-CM | POA: Diagnosis not present

## 2020-06-17 DIAGNOSIS — F1590 Other stimulant use, unspecified, uncomplicated: Secondary | ICD-10-CM | POA: Diagnosis not present

## 2020-06-17 DIAGNOSIS — Y9301 Activity, walking, marching and hiking: Secondary | ICD-10-CM | POA: Insufficient documentation

## 2020-06-17 DIAGNOSIS — W540XXA Bitten by dog, initial encounter: Secondary | ICD-10-CM | POA: Diagnosis not present

## 2020-06-17 DIAGNOSIS — Z23 Encounter for immunization: Secondary | ICD-10-CM | POA: Diagnosis not present

## 2020-06-17 NOTE — ED Triage Notes (Signed)
Pt to ED from home c/o left arm injury tonight.  States turned out lights and walking down hall and accidentally stepped on her dog and fell onto dog that bit her left forearm.  Pt with 3x3cm injury, bleeding controlled.

## 2020-06-17 NOTE — ED Notes (Signed)
Contacted [employer] and reported dog bite incident; operator st to have pt call back when d/c home to complete report

## 2020-06-18 ENCOUNTER — Emergency Department
Admission: EM | Admit: 2020-06-18 | Discharge: 2020-06-18 | Disposition: A | Discharge status: Home / Self Care | Payer: BLUE CROSS/BLUE SHIELD | Attending: Emergency Medicine | Admitting: Emergency Medicine

## 2020-06-18 DIAGNOSIS — W540XXA Bitten by dog, initial encounter: Secondary | ICD-10-CM

## 2020-06-18 MED ORDER — AMOXICILLIN-POT CLAVULANATE 875-125 MG PO TABS
1.0000 | ORAL_TABLET | Freq: Once | ORAL | Status: AC
  Administered 2020-06-18: 1 via ORAL
  Filled 2020-06-18: qty 1

## 2020-06-18 MED ORDER — AMOXICILLIN-POT CLAVULANATE 875-125 MG PO TABS: 1.0000 | ORAL_TABLET | Freq: Two times a day (BID) | ORAL | 0 refills | Status: AC

## 2020-06-18 MED ORDER — TETANUS-DIPHTH-ACELL PERTUSSIS 5-2.5-18.5 LF-MCG/0.5 IM SUSP
0.5000 mL | Freq: Once | INTRAMUSCULAR | Status: AC
  Administered 2020-06-18: 0.5 mL via INTRAMUSCULAR
  Filled 2020-06-18: qty 0.5

## 2020-06-18 MED ORDER — FLUCONAZOLE 150 MG PO TABS: 150.0000 mg | ORAL_TABLET | Freq: Every day | ORAL | 0 refills | Status: DC

## 2020-06-18 MED ORDER — LIDOCAINE HCL (PF) 1 % IJ SOLN
5.0000 mL | Freq: Once | INTRAMUSCULAR | Status: AC
  Administered 2020-06-18: 5 mL
  Filled 2020-06-18: qty 5

## 2020-06-18 NOTE — ED Notes (Signed)
Pt states dog bite to left arm (AC area), pts own dog, dog is up to date on shots. Bleeding controlled

## 2020-06-18 NOTE — Discharge Instructions (Addendum)
Please follow-up with your doctor, urgent care, or return to the emergency department for suture removal in 7 days.  Return to the emergency department for any signs of infection such as increased redness pus or fever.  Please take antibiotic for its entire course.

## 2020-06-18 NOTE — ED Provider Notes (Signed)
[facility] Emergency Department Provider Note  Time seen: 3:49 AM  I have reviewed the triage vital signs and the nursing notes.   HISTORY  Chief Complaint Animal Bite   HPI Debra Chandler is a 44 y.o. female with no significant past medical history presents to the emergency department for dog bite to her left arm.  According to the patient she was walking down her hallway it was dark and she accidentally stepped on her dog who in turn bit her arm.  Patient states the dog's vaccines are up-to-date.  Bleeding is controlled at this time.  No other injuries.   History reviewed. No pertinent past medical history.  There are no problems to display for this patient.   Past Surgical History:  Procedure Laterality Date  . FOREARM SURGERY    . HIP SURGERY      Prior to Admission medications   Medication Sig Start Date End Date Taking? Authorizing Provider  cyproheptadine (PERIACTIN) 4 MG tablet Take 1 tablet (4 mg total) by mouth 3 (three) times daily as needed for allergies. 06/02/15 04/23/19  Menshew, Charlesetta Ivory, PA-C  ranitidine (ZANTAC) 150 MG tablet Take 1 tablet (150 mg total) by mouth 2 (two) times daily. 06/02/15 04/23/19  Menshew, Charlesetta Ivory, PA-C    No Known Allergies  Family History  Problem Relation Age of Onset  . Diabetes Father   . Cancer Father     Social History Social History   Tobacco Use  . Smoking status: Current Every Day Smoker    Packs/day: 0.75    Types: Cigarettes  . Smokeless tobacco: Never Used  Vaping Use  . Vaping Use: Never used  Substance Use Topics  . Alcohol use: No  . Drug use: Yes    Types: Marijuana    Comment: last use yesterday    Review of Systems Constitutional: Negative for fever. Cardiovascular: Negative for chest pain. Respiratory: Negative for shortness of breath. Gastrointestinal: Negative for abdominal pain Musculoskeletal: Left arm pain Skin: Laceration to left forearm Neurological:  Negative for headache All other ROS negative  ____________________________________________   PHYSICAL EXAM:  VITAL SIGNS: ED Triage Vitals  Enc Vitals Group     BP 06/17/20 2108 124/67     Pulse Rate 06/17/20 2108 (!) 116     Resp 06/17/20 2108 16     Temp 06/17/20 2108 98.4 F (36.9 C)     Temp Source 06/17/20 2108 Oral     SpO2 06/17/20 2108 98 %     Weight 06/17/20 2110 110 lb (49.9 kg)     Height 06/17/20 2110 5\' 5"  (1.651 m)     Head Circumference --      Peak Flow --      Pain Score 06/17/20 2109 4     Pain Loc --      Pain Edu? --      Excl. in GC? --    Constitutional: Alert and oriented. Well appearing and in no distress. Eyes: Normal exam ENT      Head: Normocephalic and atraumatic.      Mouth/Throat: Mucous membranes are moist. Cardiovascular: Normal rate, regular rhythm. Respiratory: Normal respiratory effort without tachypnea nor retractions. Breath sounds are clear Gastrointestinal: Soft and nontender. No distention.   Musculoskeletal: Patient has an approximate 6 cm laceration to her left forearm with subcutaneous fat showing.  Hemostatic.  Neuro vastly intact distally. Neurologic:  Normal speech and language. No gross focal neurologic deficits  Skin: 6  cm laceration to the left forearm as described above. Psychiatric: Mood and affect are normal.   ____________________________________________   INITIAL IMPRESSION / ASSESSMENT AND PLAN / ED COURSE  Pertinent labs & imaging results that were available during my care of the patient were reviewed by me and considered in my medical decision making (see chart for details).   Patient presents to the emergency department after dog bite to her left forearm.  It is the patient's dog, vaccines up-to-date per patient.  She does not know when her last tetanus shot was.  We will update her tetanus shot.  As she does have a 6 cm gaping wound to her left forearm we will suture with 2-3 stitches for loose approximation,  cover with Augmentin.  Patient agreeable to plan of care.  Laceration repaired with 3 sutures.  We will discharge home with Augmentin.  LACERATION REPAIR Performed by: Minna Antis Authorized by: Minna Antis Consent: Verbal consent obtained. Risks and benefits: risks, benefits and alternatives were discussed Consent given by: patient Patient identity confirmed: provided demographic data Prepped and Draped in normal sterile fashion Wound explored  Laceration Location: Left forearm  Laceration Length: 6 cm  No Foreign Bodies seen or palpated  Anesthesia: local infiltration  Local anesthetic: lidocaine 1% without epinephrine  Anesthetic total: 4 ml  Irrigation method: syringe Amount of cleaning: standard  Skin closure: 4-0 Prolene  Number of sutures: 3  Technique: Simple interrupted  Patient tolerance: Patient tolerated the procedure well with no immediate complications.   [name] was evaluated in Emergency Department on 06/18/2020 for the symptoms described in the history of present illness. She was evaluated in the context of the global COVID-19 pandemic, which necessitated consideration that the patient might be at risk for infection with the SARS-CoV-2 virus that causes COVID-19. Institutional protocols and algorithms that pertain to the evaluation of patients at risk for COVID-19 are in a state of rapid change based on information released by regulatory bodies including the CDC and federal and state organizations. These policies and algorithms were followed during the patient's care in the ED.  ____________________________________________   FINAL CLINICAL IMPRESSION(S) / ED DIAGNOSES  Dog bite Laceration   Minna Antis, MD 06/18/20 [phone]

## 2021-01-25 ENCOUNTER — Encounter: Payer: Self-pay | Admitting: Emergency Medicine

## 2021-01-25 ENCOUNTER — Ambulatory Visit
Admission: EM | Admit: 2021-01-25 | Discharge: 2021-01-25 | Disposition: A | Discharge status: Home / Self Care | Payer: BLUE CROSS/BLUE SHIELD | Attending: Emergency Medicine | Admitting: Emergency Medicine

## 2021-01-25 DIAGNOSIS — A64 Unspecified sexually transmitted disease: Secondary | ICD-10-CM

## 2021-01-25 LAB — WET PREP, GENITAL
Clue Cells Wet Prep HPF POC: NONE SEEN
Sperm: NONE SEEN
Trich, Wet Prep: NONE SEEN
Yeast Wet Prep HPF POC: NONE SEEN

## 2021-01-25 LAB — CHLAMYDIA/NGC RT PCR (ARMC ONLY)
Chlamydia Tr: NOT DETECTED
N gonorrhoeae: NOT DETECTED

## 2021-01-25 MED ORDER — CEFTRIAXONE SODIUM 500 MG IJ SOLR
500.0000 mg | Freq: Once | INTRAMUSCULAR | Status: AC
  Administered 2021-01-25: 500 mg via INTRAMUSCULAR

## 2021-01-25 MED ORDER — DOXYCYCLINE HYCLATE 100 MG PO CAPS: 100.0000 mg | ORAL_CAPSULE | Freq: Two times a day (BID) | ORAL | 0 refills | Status: AC

## 2021-01-25 NOTE — ED Triage Notes (Signed)
Patient c/o sore throat that started yesterday. She states there are blisters in the back of her throat. She also reports vaginal odor and white/yellow vaginal discharge that started yesterday.

## 2021-01-25 NOTE — ED Provider Notes (Signed)
[facility]    CSN: [id number] Arrival date & time: 01/25/21  1509      History   Chief Complaint Chief Complaint  Patient presents with  . Sore Throat  . Vaginal Discharge    HPI Neosha Switalski is a 45 y.o. female.   HPI   45 year old female here for evaluation of sore throat and vaginal discharge.  Patient reports that she had unprotected oral and vaginal sex with her significant other 3 days ago and then yesterday she developed a sore throat and vaginal discharge with an odor.  She states that she is prone to BV so she used boric acid suppository thinking that is what it was.  Patient denies fever.  Patient has a history of genital warts and genital herpes.  History reviewed. No pertinent past medical history.  There are no problems to display for this patient.   Past Surgical History:  Procedure Laterality Date  . FOREARM SURGERY    . HIP SURGERY    . TUBAL LIGATION      OB History   No obstetric history on file.      Home Medications    Prior to Admission medications   Medication Sig Start Date End Date Taking? Authorizing Provider  doxycycline (VIBRAMYCIN) 100 MG capsule Take 1 capsule (100 mg total) by mouth 2 (two) times daily for 7 days. 01/25/21 02/01/21 Yes Becky Augusta, NP  cyproheptadine (PERIACTIN) 4 MG tablet Take 1 tablet (4 mg total) by mouth 3 (three) times daily as needed for allergies. 06/02/15 04/23/19  Menshew, Charlesetta Ivory, PA-C  ranitidine (ZANTAC) 150 MG tablet Take 1 tablet (150 mg total) by mouth 2 (two) times daily. 06/02/15 04/23/19  Menshew, Charlesetta Ivory, PA-C    Family History Family History  Problem Relation Age of Onset  . Diabetes Father   . Cancer Father     Social History Social History   Tobacco Use  . Smoking status: Current Every Day Smoker    Packs/day: 0.75    Types: Cigarettes  . Smokeless tobacco: Never Used  Vaping Use  . Vaping Use: Never used  Substance Use Topics  . Alcohol use: No  . Drug  use: Yes    Types: Marijuana    Comment: last use yesterday     Allergies   Patient has no known allergies.   Review of Systems Review of Systems  Constitutional: Negative for activity change, appetite change and fever.  HENT: Positive for sore throat.   Genitourinary: Positive for vaginal discharge. Negative for dysuria, frequency, urgency, vaginal bleeding and vaginal pain.  Skin: Negative for rash.  Hematological: Negative.   Psychiatric/Behavioral: Negative.      Physical Exam Triage Vital Signs ED Triage Vitals  Enc Vitals Group     BP 01/25/21 1553 112/64     Pulse Rate 01/25/21 1553 88     Resp 01/25/21 1553 18     Temp 01/25/21 1553 98 F (36.7 C)     Temp Source 01/25/21 1553 Oral     SpO2 01/25/21 1553 100 %     Weight 01/25/21 1551 110 lb 0.2 oz (49.9 kg)     Height 01/25/21 1551 5\' 5"  (1.651 m)     Head Circumference --      Peak Flow --      Pain Score 01/25/21 1551 0     Pain Loc --      Pain Edu? --      Excl. in  GC? --    No data found.  Updated Vital Signs BP 112/64 (BP Location: Left Arm)   Pulse 88   Temp 98 F (36.7 C) (Oral)   Resp 18   Ht 5\' 5"  (1.651 m)   Wt 110 lb 0.2 oz (49.9 kg)   LMP 12/21/2020   SpO2 100%   BMI 18.31 kg/m   Visual Acuity Right Eye Distance:   Left Eye Distance:   Bilateral Distance:    Right Eye Near:   Left Eye Near:    Bilateral Near:     Physical Exam Vitals and nursing note reviewed.  Constitutional:      Appearance: Normal appearance. She is normal weight.  HENT:     Mouth/Throat:     Mouth: Mucous membranes are moist.     Pharynx: Oropharyngeal exudate and posterior oropharyngeal erythema present.  Skin:    General: Skin is warm and dry.     Capillary Refill: Capillary refill takes less than 2 seconds.     Findings: No erythema or rash.  Neurological:     General: No focal deficit present.     Mental Status: She is alert and oriented to person, place, and time.  Psychiatric:        Mood  and Affect: Mood normal.        Behavior: Behavior normal.        Thought Content: Thought content normal.        Judgment: Judgment normal.      UC Treatments / Results  Labs (all labs ordered are listed, but only abnormal results are displayed) Labs Reviewed  WET PREP, GENITAL - Abnormal; Notable for the following components:      Result Value   WBC, Wet Prep HPF POC FEW (*)    All other components within normal limits  CHLAMYDIA/NGC RT PCR [facility] ONLY)    EKG   Radiology No results found.  Procedures Procedures (including critical care time)  Medications Ordered in UC Medications  cefTRIAXone (ROCEPHIN) injection 500 mg (has no administration in time range)    Initial Impression / Assessment and Plan / UC Course  I have reviewed the triage vital signs and the nursing notes.  Pertinent labs & imaging results that were available during my care of the patient were reviewed by me and considered in my medical decision making (see chart for details).   Patient is a pleasant, nontoxic-appearing 45 year old female here for evaluation of sore throat and vaginal discharge with odor.  She states that she had unprotected oral and vaginal sex with her boyfriend 3 days ago and then yesterday developed her symptoms.  She reports that her symptoms started with a white-yellow thick vaginal discharge that had an odor.  She initially thought it was BV and treated herself with a boric acid suppository.  She reports that later on while in church she started to develop a sore throat and when she got home she looked in the back of her throat and saw what she called white blisters.  She reports that today the white blisters appear to be further down in her throat as well.  Patient's physical exam reveals 2 pustules in the recesses of the soft palate superiorly on either side that are approximately the size of a pencil eraser and then a much larger white pus pocket on the lower left.   Patient has no cervical lymphadenopathy on exam.  Patient had wet prep and GC chlamydia swabs collected at triage.  Is negative for yeast, trichomonas, or clue cells.  There are few WBCs present.  Gonorrhea and Chlamydia swab are pending.  Will treat patient empirically for gonorrhea and chlamydia with 500 Rocephin IM in clinic and then doxycycline twice daily for 7 days at home.   Final Clinical Impressions(s) / UC Diagnoses   Final diagnoses:  STI (sexually transmitted infection)     Discharge Instructions     Take the doxycycline twice daily for 7 days for treatment of your presumptive GYN and throat infection.  Abstain from sexual intercourse until the results of your STI panel are back, and/or until you finish your doxycycline.  I would recommend that you talk with your partner about whether or not they are having any symptoms and encouraged him to be tested as well.    ED Prescriptions    Medication Sig Dispense Auth. Provider   doxycycline (VIBRAMYCIN) 100 MG capsule Take 1 capsule (100 mg total) by mouth 2 (two) times daily for 7 days. 14 capsule Becky Augusta, NP     PDMP not reviewed this encounter.   Becky Augusta, NP 01/25/21 1642

## 2021-01-25 NOTE — Discharge Instructions (Addendum)
Take the doxycycline twice daily for 7 days for treatment of your presumptive GYN and throat infection.  Abstain from sexual intercourse until the results of your STI panel are back, and/or until you finish your doxycycline.  I would recommend that you talk with your partner about whether or not they are having any symptoms and encouraged him to be tested as well.

## 2021-03-14 ENCOUNTER — Ambulatory Visit
Admission: EM | Admit: 2021-03-14 | Discharge: 2021-03-14 | Disposition: A | Discharge status: Home / Self Care | Payer: BLUE CROSS/BLUE SHIELD | Attending: Emergency Medicine | Admitting: Emergency Medicine

## 2021-03-14 ENCOUNTER — Other Ambulatory Visit: Payer: Self-pay

## 2021-03-14 DIAGNOSIS — S39012A Strain of muscle, fascia and tendon of lower back, initial encounter: Secondary | ICD-10-CM

## 2021-03-14 DIAGNOSIS — S161XXA Strain of muscle, fascia and tendon at neck level, initial encounter: Secondary | ICD-10-CM

## 2021-03-14 MED ORDER — BACLOFEN 10 MG PO TABS: 10.0000 mg | ORAL_TABLET | Freq: Three times a day (TID) | ORAL | 0 refills | Status: DC

## 2021-03-14 MED ORDER — IBUPROFEN 600 MG PO TABS: 600.0000 mg | ORAL_TABLET | Freq: Four times a day (QID) | ORAL | 0 refills | Status: AC | PRN

## 2021-03-14 NOTE — Discharge Instructions (Addendum)
Take the ibuprofen, 600 mg every 6 hours with food, on a schedule for the next 48 hours and then as needed.  Take the Baclofen, 10 mg every 8 hours, on a schedule for the next 48 hours and then as needed.  Apply moist heat to your neck/back for 30 minutes at a time 2-3 times a day to improve blood flow to the area and help remove the lactic acid causing the spasm.  Increase your oral fluid intake to a goal of your body weight in ounces to help flush the lactic acid from your system.  Follow the neck/back exercises given at discharge.  Return for reevaluation for any new or worsening symptoms.
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
## 2021-04-09 ENCOUNTER — Other Ambulatory Visit: Payer: Self-pay

## 2021-04-09 ENCOUNTER — Ambulatory Visit
Admission: EM | Admit: 2021-04-09 | Discharge: 2021-04-09 | Disposition: A | Discharge status: Home / Self Care | Payer: BLUE CROSS/BLUE SHIELD | Attending: Emergency Medicine | Admitting: Emergency Medicine

## 2021-04-09 ENCOUNTER — Encounter: Payer: Self-pay | Admitting: Emergency Medicine

## 2021-04-09 DIAGNOSIS — N898 Other specified noninflammatory disorders of vagina: Secondary | ICD-10-CM | POA: Insufficient documentation

## 2021-04-09 LAB — URINALYSIS, COMPLETE (UACMP) WITH MICROSCOPIC
Bilirubin Urine: NEGATIVE
Glucose, UA: NEGATIVE mg/dL
Ketones, ur: NEGATIVE mg/dL
Leukocytes,Ua: NEGATIVE
Nitrite: NEGATIVE
Protein, ur: NEGATIVE mg/dL
Specific Gravity, Urine: 1.025 (ref 1.005–1.030)
pH: 6.5 (ref 5.0–8.0)

## 2021-04-09 LAB — CHLAMYDIA/NGC RT PCR (ARMC ONLY)
Chlamydia Tr: NOT DETECTED
N gonorrhoeae: NOT DETECTED

## 2021-04-09 LAB — WET PREP, GENITAL
Clue Cells Wet Prep HPF POC: NONE SEEN
Sperm: NONE SEEN
Trich, Wet Prep: NONE SEEN
Yeast Wet Prep HPF POC: NONE SEEN

## 2021-04-09 NOTE — ED Triage Notes (Signed)
Pt is present today to be retested for STD. Pt states that vaginal discharge, dark urine, and slight odor.

## 2021-04-09 NOTE — Discharge Instructions (Addendum)
Testing today did not reveal the presence of bacterial vaginosis, yeast, trichomonas present in her vaginal vault.  Your gonorrhea and Chlamydia testing is still pending.  Continue your current therapy of using the boric acid suppositories at home to prevent recurrent BV.  You may want to also consider using vaginal probiotics as this may help with your discharge as well.  Schedule an appointment with your gynecologist for a full GYN evaluation.  Some of your symptoms may be related to hormonal changes affecting the acid-base balance and the bacterial balance within your vaginal vault.

## 2021-04-09 NOTE — ED Provider Notes (Signed)
He is 20 [facility]    CSN: [id number] Arrival date & time: 04/09/21  1156      History   Chief Complaint Chief Complaint  Patient presents with   Vaginal Discharge    Slight odor     HPI Debra Chandler is a 45 y.o. female.   HPI  45 year old female here for evaluation of vaginal discharge and dark urine.  Patient reports that she has had a white, thick, slimy discharge since 22.Marland Kitchen  She was tested at that time for area gonorrhea and chlamydia as her significant other had been unfaithful to her.  Tested for gonorrhea chlamydia at that time which were both negative and had a wet prep performed which does show WBCs but no clue cells, trichomoniasis, or yeast.  Patient reports that she has been using boric acid suppositories weekly to prevent recurrent BV but she still continued to have the slimy vaginal discharge.  She is concerned that it might be chlamydia as when she had chlamydia in the past her discharge was similar.  She denies any painful urination, urinary urgency or frequency, fever, abdominal pain, or low back pain.  History reviewed. No pertinent past medical history.  There are no problems to display for this patient.   Past Surgical History:  Procedure Laterality Date   FOREARM SURGERY     HIP SURGERY     TUBAL LIGATION      OB History   No obstetric history on file.      Home Medications    Prior to Admission medications   Medication Sig Start Date End Date Taking? Authorizing Provider  baclofen (LIORESAL) 10 MG tablet Take 1 tablet (10 mg total) by mouth 3 (three) times daily. 03/14/21   Becky Augusta, NP  ibuprofen (ADVIL) 600 MG tablet Take 1 tablet (600 mg total) by mouth every 6 (six) hours as needed. 03/14/21   Becky Augusta, NP  cyproheptadine (PERIACTIN) 4 MG tablet Take 1 tablet (4 mg total) by mouth 3 (three) times daily as needed for allergies. 06/02/15 04/23/19  Menshew, Charlesetta Ivory, PA-C  ranitidine (ZANTAC) 150 MG tablet Take 1  tablet (150 mg total) by mouth 2 (two) times daily. 06/02/15 04/23/19  Menshew, Charlesetta Ivory, PA-C    Family History Family History  Problem Relation Age of Onset   Diabetes Father    Cancer Father     Social History Social History   Tobacco Use   Smoking status: Every Day    Packs/day: 0.75    Types: Cigarettes   Smokeless tobacco: Never  Vaping Use   Vaping Use: Never used  Substance Use Topics   Alcohol use: No   Drug use: Yes    Types: Marijuana    Comment: last use yesterday     Allergies   Patient has no known allergies.   Review of Systems Review of Systems  Constitutional:  Negative for activity change, appetite change and fever.  Gastrointestinal:  Negative for abdominal pain.  Genitourinary:  Positive for vaginal discharge. Negative for dysuria, frequency, hematuria, urgency and vaginal bleeding.  Musculoskeletal:  Negative for back pain.  Skin:  Negative for rash.  Hematological: Negative.   Psychiatric/Behavioral: Negative.      Physical Exam Triage Vital Signs ED Triage Vitals  Enc Vitals Group     BP 04/09/21 1225 (!) 141/71     Pulse Rate 04/09/21 1225 94     Resp 04/09/21 1225 17     Temp 04/09/21  1225 98.4 F (36.9 C)     Temp src --      SpO2 04/09/21 1225 98 %     Weight --      Height --      Head Circumference --      Peak Flow --      Pain Score 04/09/21 1228 0     Pain Loc --      Pain Edu? --      Excl. in GC? --    No data found.  Updated Vital Signs BP (!) 141/71   Pulse 94   Temp 98.4 F (36.9 C)   Resp 17   SpO2 98%   Visual Acuity Right Eye Distance:   Left Eye Distance:   Bilateral Distance:    Right Eye Near:   Left Eye Near:    Bilateral Near:     Physical Exam Vitals and nursing note reviewed.  Constitutional:      General: She is not in acute distress.    Appearance: Normal appearance. She is normal weight. She is not ill-appearing.  HENT:     Head: Normocephalic and atraumatic.  Cardiovascular:      Rate and Rhythm: Normal rate and regular rhythm.     Pulses: Normal pulses.     Heart sounds: Normal heart sounds. No murmur heard.   No gallop.  Pulmonary:     Effort: Pulmonary effort is normal.     Breath sounds: Normal breath sounds. No wheezing, rhonchi or rales.  Abdominal:     General: Abdomen is flat. Bowel sounds are normal.     Palpations: Abdomen is soft.     Tenderness: There is abdominal tenderness. There is no right CVA tenderness, left CVA tenderness, guarding or rebound.  Skin:    Capillary Refill: Capillary refill takes less than 2 seconds.  Neurological:     General: No focal deficit present.     Mental Status: She is alert and oriented to person, place, and time.  Psychiatric:        Mood and Affect: Mood normal.        Behavior: Behavior normal.        Thought Content: Thought content normal.        Judgment: Judgment normal.     UC Treatments / Results  Labs (all labs ordered are listed, but only abnormal results are displayed) Labs Reviewed  WET PREP, GENITAL - Abnormal; Notable for the following components:      Result Value   WBC, Wet Prep HPF POC FEW (*)    All other components within normal limits  URINALYSIS, COMPLETE (UACMP) WITH MICROSCOPIC - Abnormal; Notable for the following components:   APPearance HAZY (*)    Hgb urine dipstick SMALL (*)    Bacteria, UA FEW (*)    All other components within normal limits  CHLAMYDIA/NGC RT PCR [facility] ONLY)              EKG   Radiology No results found.  Procedures Procedures (including critical care time)  Medications Ordered in UC Medications - No data to display  Initial Impression / Assessment and Plan / UC Course  I have reviewed the triage vital signs and the nursing notes.  Pertinent labs & imaging results that were available during my care of the patient were reviewed by me and considered in my medical decision making (see chart for details).  Patient is a pleasant,  nontoxic-appearing 45 year old female here for evaluation  of vaginal discharge that is ongoing for the last 3 months and she describes it as white, thick, and slimy.  She states she has been using boric acid suppositories to prevent recurrent BV but she states that the discharge is more akin to when she had chlamydia in the past from her previous husband.  She denies any urinary complaints, abdominal pain, or fever.  Patient's physical exam reveals a benign cardiopulmonary exam with clear lung sounds in all fields.  No CVA tenderness.  Abdomen is soft, flat, with mild suprapubic tenderness without guarding or rebound.  Patient states that she has not had any sexual intercourse since May when she found out that her significant other had been cheating on her.  She is concerned that she could still have chlamydia even though her previous testing was negative.  Will collect gonorrhea chlamydia, wet prep, and check UA.  Urinalysis shows urine of hazy appearance, small hemoglobin, 6-10 squamous epithelials, and few bacteria.  There is mucus, granular casts, and amorphous crystals present.  Wet prep is negative for yeast, trichomonas, or clue cells.  There are WBCs present.  Gonorrhea and Chlamydia testing is pending.  The etiology of patient's vaginal discharge is unclear.  She does have an OB/GYN and I will have patient follow-up with GYN for further evaluation of her discharge.  In the interim I will have her continue her boric acid suppositories to prevent recurrent BV and will suggest using vaginal probiotics.   Final Clinical Impressions(s) / UC Diagnoses   Final diagnoses:  Vaginal discharge     Discharge Instructions      Testing today did not reveal the presence of bacterial vaginosis, yeast, trichomonas present in her vaginal vault.  Your gonorrhea and Chlamydia testing is still pending.  Continue your current therapy of using the boric acid suppositories at home to prevent recurrent BV.   You may want to also consider using vaginal probiotics as this may help with your discharge as well.  Schedule an appointment with your gynecologist for a full GYN evaluation.  Some of your symptoms may be related to hormonal changes affecting the acid-base balance and the bacterial balance within your vaginal vault.     ED Prescriptions   None    PDMP not reviewed this encounter.   Becky Augusta, NP 04/09/21 1335

## 2022-10-18 ENCOUNTER — Telehealth: Payer: Self-pay

## 2022-10-18 NOTE — Telephone Encounter (Signed)
Mychart msg sent. AS, CMA

## 2023-07-10 ENCOUNTER — Telehealth: Payer: Self-pay | Admitting: Emergency Medicine

## 2023-07-10 ENCOUNTER — Ambulatory Visit
Admission: EM | Admit: 2023-07-10 | Discharge: 2023-07-10 | Disposition: A | Discharge status: Home / Self Care | Payer: BC Managed Care – PPO | Attending: Emergency Medicine | Admitting: Emergency Medicine

## 2023-07-10 ENCOUNTER — Ambulatory Visit: Payer: BC Managed Care – PPO

## 2023-07-10 DIAGNOSIS — J069 Acute upper respiratory infection, unspecified: Secondary | ICD-10-CM | POA: Diagnosis not present

## 2023-07-10 MED ORDER — AMOXICILLIN-POT CLAVULANATE 875-125 MG PO TABS: 1.0000 | ORAL_TABLET | Freq: Two times a day (BID) | ORAL | 0 refills | Status: AC

## 2023-07-10 MED ORDER — BENZONATATE 100 MG PO CAPS: 200.0000 mg | ORAL_CAPSULE | Freq: Three times a day (TID) | ORAL | 0 refills | Status: AC

## 2023-07-10 MED ORDER — AEROCHAMBER MV MISC: 2 refills | Status: AC

## 2023-07-10 MED ORDER — PROMETHAZINE-DM 6.25-15 MG/5ML PO SYRP: 5.0000 mL | ORAL_SOLUTION | Freq: Four times a day (QID) | ORAL | 0 refills | Status: AC | PRN

## 2023-07-10 MED ORDER — VENTOLIN HFA 108 (90 BASE) MCG/ACT IN AERS: 1.0000 | INHALATION_SPRAY | Freq: Four times a day (QID) | RESPIRATORY_TRACT | 0 refills | Status: AC | PRN

## 2023-07-10 MED ORDER — IPRATROPIUM BROMIDE 0.06 % NA SOLN: 2.0000 | Freq: Four times a day (QID) | NASAL | 12 refills | Status: AC

## 2023-07-10 NOTE — Discharge Instructions (Signed)
Your chest x-ray did not show any evidence of pneumonia.  However, since your symptoms have been present for the last 2 weeks, and worsening of lactate, I do feel a trial of antibiotics is warranted.  Take the Augmentin 875 mg twice daily with food for 7 days for treatment of your respiratory infection.  Use the albuterol inhaler with the spacer, 1 to 2 puffs every 4-6 hours, as needed for shortness of breath or wheezing.  Use the Tessalon Perles every 8 hours during the day as needed for cough.  Take them with a small sip of water.  They may give you numbness to the base of your tongue or metallic taste in her mouth, this is normal.  Use the Promethazine DM cough syrup at bedtime as needed for cough and congestion.  If your symptoms or not improving, or they worsen, either return for reevaluation or see your primary care provider.

## 2023-07-10 NOTE — Telephone Encounter (Signed)
Patient called to request that her cough syrup prescription be sent to the pharmacy as it was missed at her earlier visit.

## 2023-07-10 NOTE — ED Provider Notes (Signed)
[facility]    CSN: [id number] Arrival date & time: 07/10/23  2952      History   Chief Complaint Chief Complaint  Patient presents with   Nasal Congestion    HPI Debra Chandler is a 47 y.o. female.   HPI  47 year old female presents for evaluation of 2 weeks worth of cold symptoms that have worsened over the last 8 days.  She reports having significant chest tightness and a cough though limited sputum production.  She is endorsing nasal congestion with a white nasal discharge.  She also has had shortness of breath and wheezing but no measured fever.  The patient is a smoker but reports that over the last week she has not smoked much due to the fact that she has been having difficulty breathing.  History reviewed. No pertinent past medical history.  There are no problems to display for this patient.   Past Surgical History:  Procedure Laterality Date   FOREARM SURGERY     HIP SURGERY     TUBAL LIGATION      OB History   No obstetric history on file.      Home Medications    Prior to Admission medications   Medication Sig Start Date End Date Taking? Authorizing Provider  albuterol (VENTOLIN HFA) 108 (90 Base) MCG/ACT inhaler Inhale 1-2 puffs into the lungs every 6 (six) hours as needed for wheezing or shortness of breath. 07/10/23  Yes Becky Augusta, NP  amoxicillin-clavulanate (AUGMENTIN) 875-125 MG tablet Take 1 tablet by mouth every 12 (twelve) hours for 7 days. 07/10/23 07/17/23 Yes Becky Augusta, NP  benzonatate (TESSALON) 100 MG capsule Take 2 capsules (200 mg total) by mouth every 8 (eight) hours. 07/10/23  Yes Becky Augusta, NP  ipratropium (ATROVENT) 0.06 % nasal spray Place 2 sprays into both nostrils 4 (four) times daily. 07/10/23  Yes Becky Augusta, NP  Spacer/Aero-Holding Chambers (AEROCHAMBER MV) inhaler Use as instructed 07/10/23  Yes Becky Augusta, NP  ibuprofen (ADVIL) 600 MG tablet Take 1 tablet (600 mg total) by mouth every 6 (six) hours as  needed. 03/14/21   Becky Augusta, NP  cyproheptadine (PERIACTIN) 4 MG tablet Take 1 tablet (4 mg total) by mouth 3 (three) times daily as needed for allergies. 06/02/15 04/23/19  Menshew, Charlesetta Ivory, PA-C  ranitidine (ZANTAC) 150 MG tablet Take 1 tablet (150 mg total) by mouth 2 (two) times daily. 06/02/15 04/23/19  Menshew, Charlesetta Ivory, PA-C    Family History Family History  Problem Relation Age of Onset   Diabetes Father    Cancer Father     Social History Social History   Tobacco Use   Smoking status: Every Day    Current packs/day: 0.75    Types: Cigarettes   Smokeless tobacco: Never  Vaping Use   Vaping status: Never Used  Substance Use Topics   Alcohol use: No   Drug use: Yes    Types: Marijuana    Comment: last use yesterday     Allergies   Patient has no known allergies.   Review of Systems Review of Systems  Constitutional:  Negative for fever.  HENT:  Positive for congestion and rhinorrhea.   Respiratory:  Positive for cough, shortness of breath and wheezing.      Physical Exam Triage Vital Signs ED Triage Vitals [07/10/23 1025]  Encounter Vitals Group     BP      Systolic BP Percentile      Diastolic BP Percentile  Pulse      Resp      Temp      Temp src      SpO2      Weight      Height      Head Circumference      Peak Flow      Pain Score 5     Pain Loc      Pain Education      Exclude from Growth Chart    No data found.  Updated Vital Signs BP 125/81 (BP Location: Right Arm)   Pulse 73   Temp 98 F (36.7 C) (Oral)   Resp 16   Ht 5\' 5"  (1.651 m)   Wt 110 lb (49.9 kg)   LMP 05/01/2023   SpO2 97%   BMI 18.30 kg/m   Visual Acuity Right Eye Distance:   Left Eye Distance:   Bilateral Distance:    Right Eye Near:   Left Eye Near:    Bilateral Near:     Physical Exam Vitals and nursing note reviewed.  Constitutional:      Appearance: Normal appearance. She is not ill-appearing.  HENT:     Head: Normocephalic and  atraumatic.     Right Ear: Tympanic membrane, ear canal and external ear normal. There is no impacted cerumen.     Left Ear: Tympanic membrane, ear canal and external ear normal. There is no impacted cerumen.     Nose: Congestion present. No rhinorrhea.     Comments: Mucosa is erythematous and edematous without appreciable discharge.    Mouth/Throat:     Mouth: Mucous membranes are moist.     Pharynx: Oropharynx is clear. No oropharyngeal exudate or posterior oropharyngeal erythema.  Cardiovascular:     Rate and Rhythm: Normal rate and regular rhythm.     Pulses: Normal pulses.     Heart sounds: Normal heart sounds. No murmur heard.    No friction rub. No gallop.  Pulmonary:     Effort: Pulmonary effort is normal.     Breath sounds: Normal breath sounds. No wheezing, rhonchi or rales.  Musculoskeletal:     Cervical back: Normal range of motion and neck supple. No tenderness.  Lymphadenopathy:     Cervical: No cervical adenopathy.  Skin:    General: Skin is warm and dry.     Capillary Refill: Capillary refill takes less than 2 seconds.     Findings: No rash.  Neurological:     General: No focal deficit present.     Mental Status: She is alert and oriented to person, place, and time.      UC Treatments / Results  Labs (all labs ordered are listed, but only abnormal results are displayed) Labs Reviewed - No data to display  EKG   Radiology No results found.  Procedures Procedures (including critical care time)  Medications Ordered in UC Medications - No data to display  Initial Impression / Assessment and Plan / UC Course  I have reviewed the triage vital signs and the nursing notes.  Pertinent labs & imaging results that were available during my care of the patient were reviewed by me and considered in my medical decision making (see chart for details).   Patient is a pleasant, nontoxic-appearing 47 year old female presenting for evaluation of 2 weeks worth of  respiratory symptoms as outlined HPI above.  Patient vital signs are reassuring and she is afebrile with an oral temp of 98, heart rate 73, respirate 16,  and a 97% room air oxygen saturation.  She is able to speak in full sentence without dyspnea or tachypnea.  She does have a moist sounding cough in the exam room though her chest is clear to auscultation in all fields.  She does have inflamed nasal mucosa so this could be postnasal drip that stroking the patient's cough.  However, given her history of smoking I will order chest x-ray to evaluate for any acute cardiopulmonary process.  Chest x-ray independently reviewed and evaluated by me.  Impression: Lung fields are well aerated and cardiomediastinal silhouette appears normal.  Radiology overread is pending.  I will discharge patient home with a diagnosis of URI with cough.  Given that her symptoms have been present for the last 2 weeks and worsening over the last 8 I do feel a trial of antibiotics is warranted.  I will discharge her home on Augmentin 875 twice daily for 7 days.  Also a prescription for an albuterol inhaler, 1 to 2 puffs every 4-6 hours with a spacer as needed for any shortness breath or wheezing.  Tessalon Perles for cough to the day and Promethazine DM cough syrup for nighttime.  Return precautions reviewed.  Work note provided.   Final Clinical Impressions(s) / UC Diagnoses   Final diagnoses:  URI with cough and congestion     Discharge Instructions      Your chest x-ray did not show any evidence of pneumonia.  However, since your symptoms have been present for the last 2 weeks, and worsening of lactate, I do feel a trial of antibiotics is warranted.  Take the Augmentin 875 mg twice daily with food for 7 days for treatment of your respiratory infection.  Use the albuterol inhaler with the spacer, 1 to 2 puffs every 4-6 hours, as needed for shortness of breath or wheezing.  Use the Tessalon Perles every 8 hours during the  day as needed for cough.  Take them with a small sip of water.  They may give you numbness to the base of your tongue or metallic taste in her mouth, this is normal.  Use the Promethazine DM cough syrup at bedtime as needed for cough and congestion.  If your symptoms or not improving, or they worsen, either return for reevaluation or see your primary care provider.     ED Prescriptions     Medication Sig Dispense Auth. Provider   albuterol (VENTOLIN HFA) 108 (90 Base) MCG/ACT inhaler Inhale 1-2 puffs into the lungs every 6 (six) hours as needed for wheezing or shortness of breath. 18 g Becky Augusta, NP   Spacer/Aero-Holding Chambers (AEROCHAMBER MV) inhaler Use as instructed 1 each Becky Augusta, NP   amoxicillin-clavulanate (AUGMENTIN) 875-125 MG tablet Take 1 tablet by mouth every 12 (twelve) hours for 7 days. 14 tablet Becky Augusta, NP   benzonatate (TESSALON) 100 MG capsule Take 2 capsules (200 mg total) by mouth every 8 (eight) hours. 21 capsule Becky Augusta, NP   ipratropium (ATROVENT) 0.06 % nasal spray Place 2 sprays into both nostrils 4 (four) times daily. 15 mL Becky Augusta, NP      PDMP not reviewed this encounter.   Becky Augusta, NP 07/10/23 1128

## 2023-07-10 NOTE — ED Triage Notes (Signed)
Patient presents with chest congestion x 2 weeks. Ribs hurts from coughing, mucus changing colors. Treated with herbs, essential oils, cough medicine & pearls.
